# Patient Record
Sex: Male | Born: 1975 | Race: Black or African American | Hispanic: No | Marital: Married | Smoking: Never smoker
Health system: Southern US, Community
[De-identification: ages and names within clinical notes are randomized; demographics above are authoritative.]

---

## 2006-05-19 ENCOUNTER — Emergency Department (HOSPITAL_COMMUNITY): Admission: EM | Admit: 2006-05-19 | Discharge: 2006-05-19 | Payer: Self-pay | Admitting: Emergency Medicine

## 2007-04-05 ENCOUNTER — Emergency Department (HOSPITAL_COMMUNITY): Admission: EM | Admit: 2007-04-05 | Discharge: 2007-04-05 | Payer: Self-pay | Admitting: Emergency Medicine

## 2007-05-17 ENCOUNTER — Emergency Department (HOSPITAL_COMMUNITY): Admission: EM | Admit: 2007-05-17 | Discharge: 2007-05-17 | Payer: Self-pay | Admitting: Family Medicine

## 2010-03-27 ENCOUNTER — Emergency Department (HOSPITAL_COMMUNITY): Admission: EM | Admit: 2010-03-27 | Discharge: 2010-03-27 | Payer: Self-pay | Admitting: Emergency Medicine

## 2010-12-01 ENCOUNTER — Emergency Department (HOSPITAL_COMMUNITY)
Admission: EM | Admit: 2010-12-01 | Discharge: 2010-12-01 | Payer: Self-pay | Source: Home / Self Care | Admitting: Family Medicine

## 2018-10-07 ENCOUNTER — Emergency Department (HOSPITAL_COMMUNITY)
Admission: EM | Admit: 2018-10-07 | Discharge: 2018-10-07 | Disposition: A | Discharge status: Home / Self Care | Payer: No Typology Code available for payment source | Attending: Emergency Medicine | Admitting: Emergency Medicine

## 2018-10-07 ENCOUNTER — Emergency Department (HOSPITAL_COMMUNITY): Payer: No Typology Code available for payment source

## 2018-10-07 ENCOUNTER — Encounter (HOSPITAL_COMMUNITY): Payer: Self-pay | Admitting: *Deleted

## 2018-10-07 DIAGNOSIS — M5135 Other intervertebral disc degeneration, thoracolumbar region: Secondary | ICD-10-CM | POA: Diagnosis not present

## 2018-10-07 DIAGNOSIS — Z79899 Other long term (current) drug therapy: Secondary | ICD-10-CM | POA: Insufficient documentation

## 2018-10-07 DIAGNOSIS — M79605 Pain in left leg: Secondary | ICD-10-CM | POA: Diagnosis not present

## 2018-10-07 DIAGNOSIS — R202 Paresthesia of skin: Secondary | ICD-10-CM | POA: Insufficient documentation

## 2018-10-07 DIAGNOSIS — M5137 Other intervertebral disc degeneration, lumbosacral region: Secondary | ICD-10-CM

## 2018-10-07 DIAGNOSIS — R030 Elevated blood-pressure reading, without diagnosis of hypertension: Secondary | ICD-10-CM

## 2018-10-07 MED ORDER — PREDNISONE 10 MG PO TABS: 40.0000 mg | ORAL_TABLET | Freq: Every day | ORAL | 0 refills | Status: AC

## 2018-10-07 NOTE — ED Provider Notes (Signed)
[facility] EMERGENCY DEPARTMENT Provider Note   CSN: [id number] Arrival date & time: 10/07/18  4098     History   Chief Complaint Chief Complaint  Patient presents with  . Leg Pain    HPI Eric Page is a 42 y.o. male presenting for left leg pain that began on 09/16/2018 after an injury at work.  Patient states that he was working on a piece of equipment when his left foot slid out from under him causing him to to a "split".  Patient states that he caught himself before falling to the ground however had immediate pain around his left knee.  Patient describes the pain around his left knee and lower left thigh as a throbbing pain that is constant and mild in severity.  Pain is worsened with palpation of the area.  Patient has some associated tingling to the lateral left knee and lateral left lower thigh that is intermittent.  Patient states that he was evaluated by his primary care doctor as well as the employer's physician and prescribed muscle relaxer.  Patient states that his muscle relaxer has been helping with his pain and tingling.  Patient states that he presents today at the request of his employer's physician for "MRI of his knee"due to not improving pain.   Additionally patient states that he has had some left-sided lower back pain that developed over the past week that typically happens when he was sitting at work for long periods of time.  Patient denies injury or trauma to his back.  Patient states that this is a mild throb that he states is intermittent and only present after sitting for long periods of time.  Patient states that this pain is also improved with the muscle relaxer.  Patient denies saddle area paresthesias, bowel or bladder incontinence, fever, IV drug use, weakness of the extremities.  Patient states that he has not had previous imaging of his knee or lower back. HPI  History reviewed. No pertinent past medical history.  There are no  active problems to display for this patient.   History reviewed. No pertinent surgical history.      Home Medications    Prior to Admission medications   Medication Sig Start Date End Date Taking? Authorizing Provider  nabumetone (RELAFEN) 750 MG tablet Take 750 mg by mouth every 12 (twelve) hours.   Yes [provider]  tiZANidine (ZANAFLEX) 4 MG tablet Take 4 mg by mouth 3 (three) times daily.   Yes [provider]  predniSONE (DELTASONE) 10 MG tablet Take 4 tablets (40 mg total) by mouth daily for 5 days. 10/07/18 10/12/18  Bill Salinas, PA-C    Family History History reviewed. No pertinent family history.  Social History Social History   Tobacco Use  . Smoking status: Never Smoker  . Smokeless tobacco: Never Used  Substance Use Topics  . Alcohol use: Not on file  . Drug use: Not on file     Allergies   Patient has no known allergies.   Review of Systems Review of Systems  Constitutional: Negative.  Negative for chills and fever.  Musculoskeletal: Positive for arthralgias and back pain. Negative for neck pain.  Skin: Negative.  Negative for color change and wound.  Neurological: Positive for numbness. Negative for dizziness, syncope and weakness.    Physical Exam Updated Vital Signs BP (!) 152/97 (BP Location: Right Arm)   Pulse (!) 55   Temp 98 F (36.7 C)   Resp  16   SpO2 98%   Physical Exam  Constitutional: He appears well-developed and well-nourished. No distress.  HENT:  Head: Normocephalic and atraumatic.  Right Ear: External ear normal.  Left Ear: External ear normal.  Nose: Nose normal.  Eyes: Pupils are equal, round, and reactive to light. EOM are normal.  Neck: Trachea normal and normal range of motion. No tracheal deviation present.  Cardiovascular:  Pulses:      Dorsalis pedis pulses are 2+ on the right side, and 2+ on the left side.       Posterior tibial pulses are 2+ on the right side, and 2+ on the left side.    Pulmonary/Chest: Effort normal and breath sounds normal. No respiratory distress.  Abdominal: Soft. There is no tenderness. There is no rebound and no guarding.  Musculoskeletal: Normal range of motion.       Right knee: Normal.       Left knee: He exhibits normal range of motion, no swelling, no ecchymosis, no deformity and no erythema. Tenderness found. Lateral joint line tenderness noted. No medial joint line and no patellar tendon tenderness noted.       Right ankle: Normal.       Left ankle: Normal.       Cervical back: Normal.       Thoracic back: Normal.       Lumbar back: He exhibits no bony tenderness, no swelling and no deformity.       Back:       Right upper leg: Normal.       Left upper leg: He exhibits tenderness. He exhibits no swelling, no edema and no deformity.       Right lower leg: Normal.       Left lower leg: Normal.       Legs:      Right foot: Normal.       Left foot: Normal.  Left Knee:   Appearance normal. No obvious deformity. No skin swelling, erythema, heat, fluctuance or break of the skin.  Diffuse tenderness over lateral aspect of the knee as well as up lateral thigh along the IT band..  Active and passive flexion and extension intact with some increased pain however no crepitus. Negative anterior/poster drawer bilaterally. Negative ballottement test. No varus or valgus laxity or locking. No tenderness to palpation of hips or ankles.Compartments soft. Neurovascularly intact distally to site of injury.  No midline spinal tenderness to palpation.  No paraspinal muscular tenderness to palpation no spinal crepitus step-off or deformity noted.  Area indicated on image his were patient reports his intermittent lower back pain after sitting, left hip/lower lumbar region.  Feet:  Right Foot:  Protective Sensation: 3 sites tested. 3 sites sensed.  Left Foot:  Protective Sensation: 3 sites tested. 3 sites sensed.  Neurological: He is alert. No sensory deficit.  GCS eye subscore is 4. GCS verbal subscore is 5. GCS motor subscore is 6.  Speech is clear and goal oriented, follows commands Major Cranial nerves without deficit, no facial droop Normal strength in upper and lower extremities bilaterally including dorsiflexion and plantar flexion, strong and equal grip strength Sensation normal to light and sharp touch Moves extremities without ataxia, coordination intact Normal gait Normal heel-shin and balance  Skin: Skin is warm and dry. Capillary refill takes less than 2 seconds.  Psychiatric: He has a normal mood and affect. His behavior is normal.    ED Treatments / Results  Labs (all labs ordered are  listed, but only abnormal results are displayed) Labs Reviewed - No data to display  EKG None  Radiology Dg Lumbar Spine Complete  Result Date: 10/07/2018 CLINICAL DATA:  Left side pain, low back pain. EXAM: LUMBAR SPINE - COMPLETE 4+ VIEW COMPARISON:  None. FINDINGS: Disc space narrowing at L5-S1 and T12-L1. Normal alignment. No fracture. SI joints are symmetric and unremarkable. 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 No acute bony abnormality. Electronically Signed   By: Charlett Nose M.D.   On: 10/07/2018 11:05   Dg Knee Complete 4 Views Left  Result Date: 10/07/2018 CLINICAL DATA:  Got hurt on 9/20 still having Lat side pain and lower back with x-bilat post hip pain EXAM: LEFT KNEE - COMPLETE 4+ VIEW COMPARISON:  None. FINDINGS: No evidence of fracture, dislocation, or joint effusion. No evidence of arthropathy or other focal bone abnormality. Soft tissues are unremarkable. IMPRESSION: Negative. Electronically Signed   By: Norva Pavlov M.D.   On: 10/07/2018 11:06   Dg Hips Bilat W Or Wo Pelvis 2 Views  Result Date: 10/07/2018 CLINICAL DATA:  Got hurt on 9/20 still having Lat side pain and lower back with x-bilat post hip pain EXAM: DG HIP (WITH OR WITHOUT PELVIS) 2V BILAT COMPARISON:  None.  FINDINGS: There is no evidence of hip fracture or dislocation. There is no evidence of arthropathy or other focal bone abnormality. IMPRESSION: Negative. Electronically Signed   By: Norva Pavlov M.D.   On: 10/07/2018 11:05    Procedures Procedures (including critical care time)  Medications Ordered in ED Medications - No data to display   Initial Impression / Assessment and Plan / ED Course  I have reviewed the triage vital signs and the nursing notes.  Pertinent labs & imaging results that were available during my care of the patient were reviewed by me and considered in my medical decision making (see chart for details).  Clinical Course as of Oct 07 1130  Fri Oct 07, 2018  1038 Discussed with Dr. Adriana Simas, d/c with symptomatic treatment and prednisone and outpatient follow-up   [BM]    Clinical Course User Index [BM] Bill Salinas, PA-C   Patient presenting for approximately 3 weeks of left lateral knee and lower thigh pain and tingling after injury at work.  Physical examination today unremarkable, no signs of injury or infection.  No signs of vascular compromise.  Patient with full range of motion and strength however with increased pain.  Patient is ambulatory without assistance or difficulty.  Neurovascularly intact to bilateral lower extremities.  Patient has been using muscle relaxer and anti-inflammatory as prescribed by primary care provider with great relief of symptoms.  Imaging of hips negative.   Imaging of left knee negative.   Imaging of lumbar spine with some degenerative disc disease however no acute findings.  Patient informed of imaging findings and need for follow-up.  No neurological deficits and normal neuro exam.  Patient can walk but states is painful.  Patient denies loss of bowel/bladder control or saddle area paresthesias.  No concern for cauda equina.  No fever, night sweats, weight loss, h/o cancer, or IVDU. RICE protocol discussed with patient.   Patient informed that he may continue to use anti-inflammatory muscle relaxer as prescribed by primary care.  Patient's paresthesias discussed with Dr. Adriana Simas.  Likely nerve inflammation.  Patient has been prescribed a short course of prednisone.  Patient denies recent steroid use.  Patient denies history of diabetes.  Patient has  been given orthopedic referral.  Patient also informed of elevated blood pressure in emergency department today.  Patient asymptomatic at this time.  Patient informed to follow-up with his primary care provider for blood pressure recheck and medication management as soon as possible.  Patient informed of signs and symptoms of hypertensive urgency and emergency and to return to emergency department if these develop.  At this time there does not appear to be any evidence of an acute emergency medical condition and the patient appears stable for discharge with appropriate outpatient follow up. Diagnosis was discussed with patient who verbalizes understanding of care plan and is agreeable to discharge. I have discussed return precautions with patient who verbalizes understanding of return precautions. Patient strongly encouraged to follow-up with their PCP. All questions answered.  Patient's case discussed with Dr. Adriana Simas who agrees with plan to discharge with prednisone and follow-up.   Note: Portions of this report may have been transcribed using voice recognition software. Every effort was made to ensure accuracy; however, inadvertent computerized transcription errors may still be present.  Final Clinical Impressions(s) / ED Diagnoses   Final diagnoses:  Left leg pain  Paresthesia of left leg  Elevated blood pressure reading    ED Discharge Orders         Ordered    predniSONE (DELTASONE) 10 MG tablet  Daily     10/07/18 1129           Elizabeth Palau 10/07/18 1214    Donnetta Hutching, MD 10/08/18 0745

## 2018-10-07 NOTE — ED Triage Notes (Signed)
Pt in c/o left leg injury in September while at work, has a Scientist, water quality comp claim but states pain isn't getting better so he came here to get an MRI, pt ambulatory to room without distress, also c/o lower back pain as well

## 2018-10-07 NOTE — Discharge Instructions (Signed)
Please return to the Emergency Department for any new or worsening symptoms or if your symptoms do not improve. Please be sure to follow up with your Primary Care Physician as soon as possible regarding your visit today. If you do not have a Primary Doctor please use the resources below to establish one. It is likely that the tingling to your lateral left knee is due to nerve inflammation.  Please use the prednisone as prescribed to help with the tingling.   You may continue to use the muscle relaxer and anti-inflammatory as prescribed by your primary care doctor. Please follow-up with the orthopedic specialist in regards to your knee pain and tingling. Your x-rays today showed some degenerative disc disease of your spine.  Please follow-up with your primary care doctor and the orthopedic doctor regarding this. Your blood pressure was elevated today in the emergency department.  Please follow-up with your primary care doctor for blood pressure recheck and medication management as soon as possible.  Contact a health care provider if: You continue to have episodes of paresthesia. Your burning or prickling feeling gets worse when you walk. You have pain, cramps, or dizziness. You develop a rash. Get help right away if: You feel weak. You have trouble walking or moving. You have problems with speech, understanding, or vision. You feel confused. You cannot control your bladder or bowel movements. You have numbness after an injury. You faint. Contact a health care provider if: Your knee pain continues, changes, or gets worse. You have a fever along with knee pain. Your knee buckles or locks up. Your knee swells, and the swelling becomes worse. Get help right away if: Your knee feels warm to the touch. You cannot move your knee. You have severe pain in your knee. You have chest pain. You have trouble breathing. Do not take your medicine if  develop an itchy rash, swelling in your mouth or  lips, or difficulty breathing.   RESOURCE GUIDE  Chronic Pain Problems: Contact [facility]  [phone] Patients need to be referred by their primary care doctor.  Insufficient Money for Medicine: Contact United Way:  call "211" or [facility] 218-742-7324.  No Primary Care Doctor: Call [facility]  [phone] - can help you locate a primary care doctor that  accepts your insurance, provides certain services, etc. Physician Referral [phone]  Agencies that provide inexpensive medical care: [facility]  [phone] [facility]  [phone] [facility]  [phone] [facility]  [phone] Planned Parenthood  [phone] [facility]  [phone]  Medicaid-accepting [facility] Providers: [facility]- [address]  [phone], Mon-Fri 9am-7pm, Sat 9am-1pm [facility]- [address] [place], [address] [place]  [phone] [facility]- [address] [place]  [phone] [facility]- [address]  [phone] Renaye Rakers- [address] [place], [address], [phone]  Only accepts [place] [facility] [place] patients after they have their name  applied to their card  Self Pay (no insurance) in [facility]: Sickle Cell Patients: Dr Willey Blade, [facility]  [address] [place], [phone] [facility]gent Care- [address] [place]  [phone]       [facility] [place]- [address]       -     [facility]- see information  above (Speak to [facility] if you do not have insurance)       -  [facility]- [address] [place], [phone]       -  [facility]- [address]  [phone]       -  [facility]- [address] [phone]       -  Dr Julio Sicks-  [address], [place], [phone]       -  [facility]- [address] [phone]       -  [facility]- [address] [phone], also [address], [phone]       -    [facility]- [address] [place], [phone], 1st & 3rd Saturday   every month, 10am-1pm  1) Find a Doctor and Pay Out of Pocket Although you won't have to find out who is covered by your insurance plan, it is a good idea to ask around and get recommendations. You will then need to call the office and see if the doctor you have chosen will accept you as a new patient and what types of options they offer for patients who are self-pay. Some doctors offer discounts or will set up payment plans for their patients who do not have insurance, but you will need to ask so you aren't surprised when you get to your appointment.  2) Contact Your Local Health Department Not all health departments have doctors that can see patients for sick visits, but many do, so it is worth a call to see if yours does. If you don't know where your local health department is, you can check in your phone book. The CDC also has a tool to help you locate your state's health department, and many state websites also have listings of all of their local health departments.  3) Find a Walk-in Clinic If your illness is not likely to be very severe or complicated, you may want to try a walk in clinic. These are popping up all over the country in pharmacies, drugstores, and shopping centers. They're usually staffed by nurse practitioners or physician assistants that have been trained to treat common illnesses and complaints. They're usually fairly quick and inexpensive. However, if you have serious medical issues or chronic medical problems, these are probably not your best option  STD Testing [facility] Department of [facility], STD Clinic, [address] [place], phone [phone] or [phone].  Monday - Friday, call for an appointment. [facility] Department  of [employer], STD Clinic, [place] [address], [place], phone [phone] or [phone].  Monday - Friday, call for an appointment.  Abuse/Neglect: [facility] Child Abuse Hotline 5758769583 [facility] Child Abuse Hotline 850-852-2556 (After Hours)  Emergency Shelter:  [facility] 352-564-1981  Maternity Homes: Room at the [place] [facility] ([phone] 028-9891 [facility] (252)360-9871  MRSA Hotline #:   419-584-6571  [facility] Resources  [facility] [place]  [employer] [facility] Dept. [address]                 [address]         [address] [place] [address]  [address]  Cristobal Goldmann Phone:  [phone]                                  Phone:  [phone]                   Phone:  [phone]  [facility], [phone] [facility] [place]- [phone]       -     [facility] in [place], [address]                                  6152657451, [facility] Child Abuse Hotline 718-781-4643 or 678 268 3681 (After Hours)   Behavioral Health Services  Substance Abuse Resources: Alcohol and Drug Services  850-612-2381 [facility] (360)831-8602 [facility] (316)686-9336 Floydene Flock (202)266-1980 Residential & Outpatient Substance Abuse Program  (608) [phone]  Psychological Services: [facility]  [phone] [facility] Services  [phone] [facility], [phone] [place]. [address], [place], ACCESS LINE: 717-624-5620 or 3854904929, [employer]za  Dental Assistance  If unable to pay or uninsured, contact:  [facility] or [facility]. to become qualified for the adult dental clinic.  Patients with Medicaid: [facility] [phone] [address], [phone] [address], [phone]  If unable to pay, or uninsured, contact [facility] (469)233-2891) or [facility] Department [phone] in [place], [phone] in [facility] to become qualified for the adult dental clinic   Other Low-Cost Community Dental Services: [facility]- [address] [place], [place], [place], [address], [phone], [address], 2nd and 4th Thursday of the month at 6:30am.  10 clients each day by appointment, can sometimes see walk-in patients if someone does not show for an appointment. [facility]- [address] Ether Griffins [place], [place], [address], [phone] [facility] [address], [place], [place], [address], [phone] [facility] Health Department- [phone] [facility] Health Department- [phone] [facility] [phone]

## 2018-10-07 NOTE — ED Notes (Signed)
Patient transported to X-ray 

## 2019-08-16 IMAGING — DX DG HIP (WITH OR WITHOUT PELVIS) 2V BILAT
3 series · 3 of 3 positions shown · non-contrast
Comparison: None.

CLINICAL DATA: Got hurt on [DATE] still having Lat side pain and
lower back with x-bilat post hip pain

EXAM:
DG HIP (WITH OR WITHOUT PELVIS) 2V BILAT

[t pelvis ap]
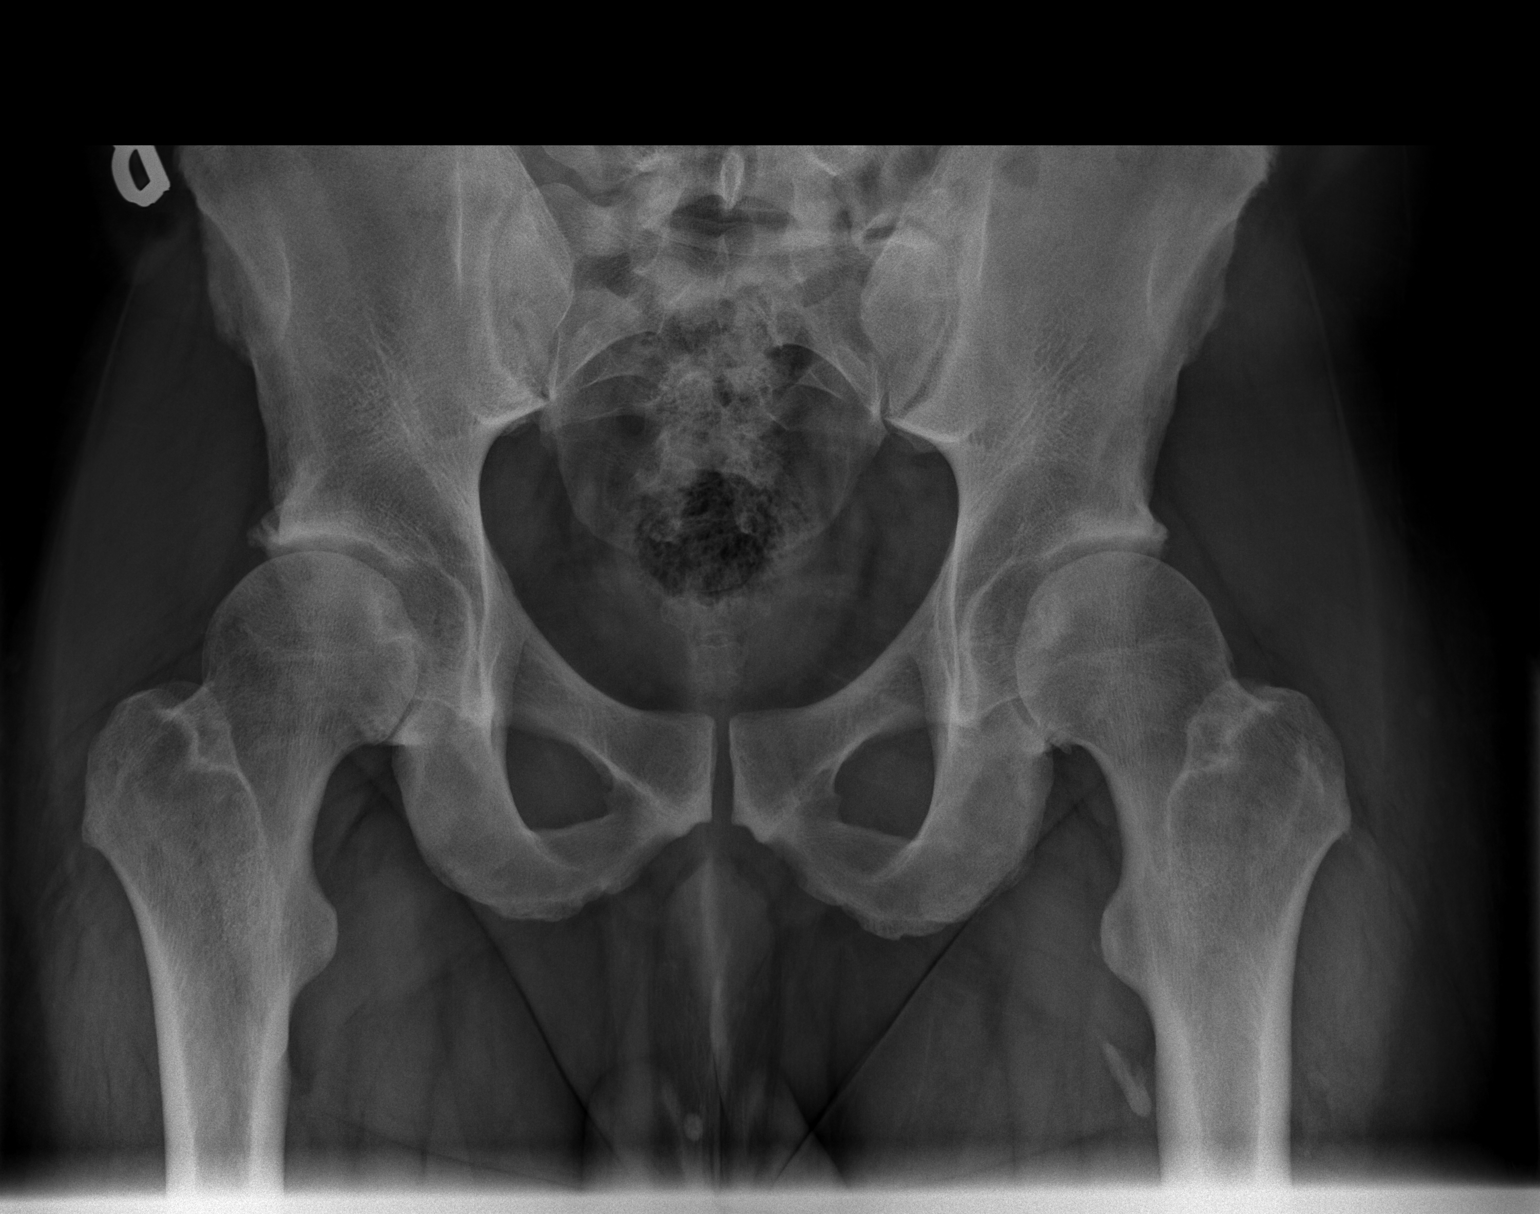

[t hip frog leg right]
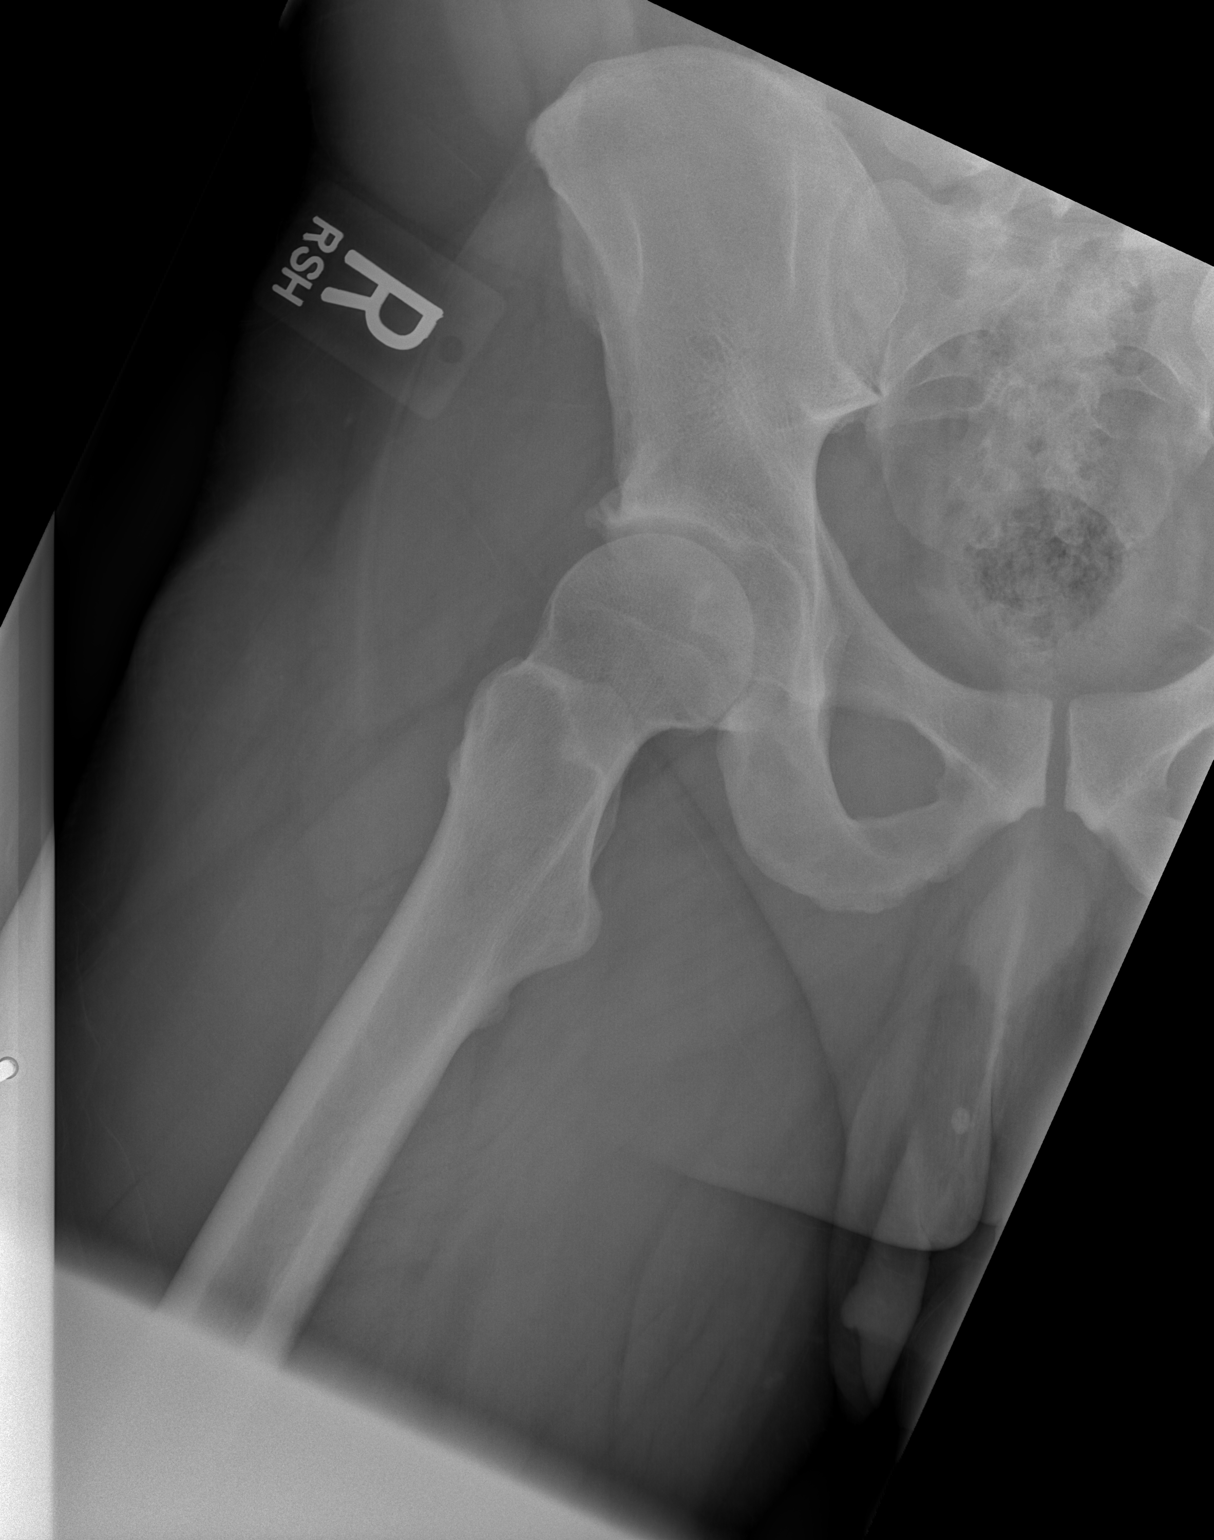

[t hip frog leg left]
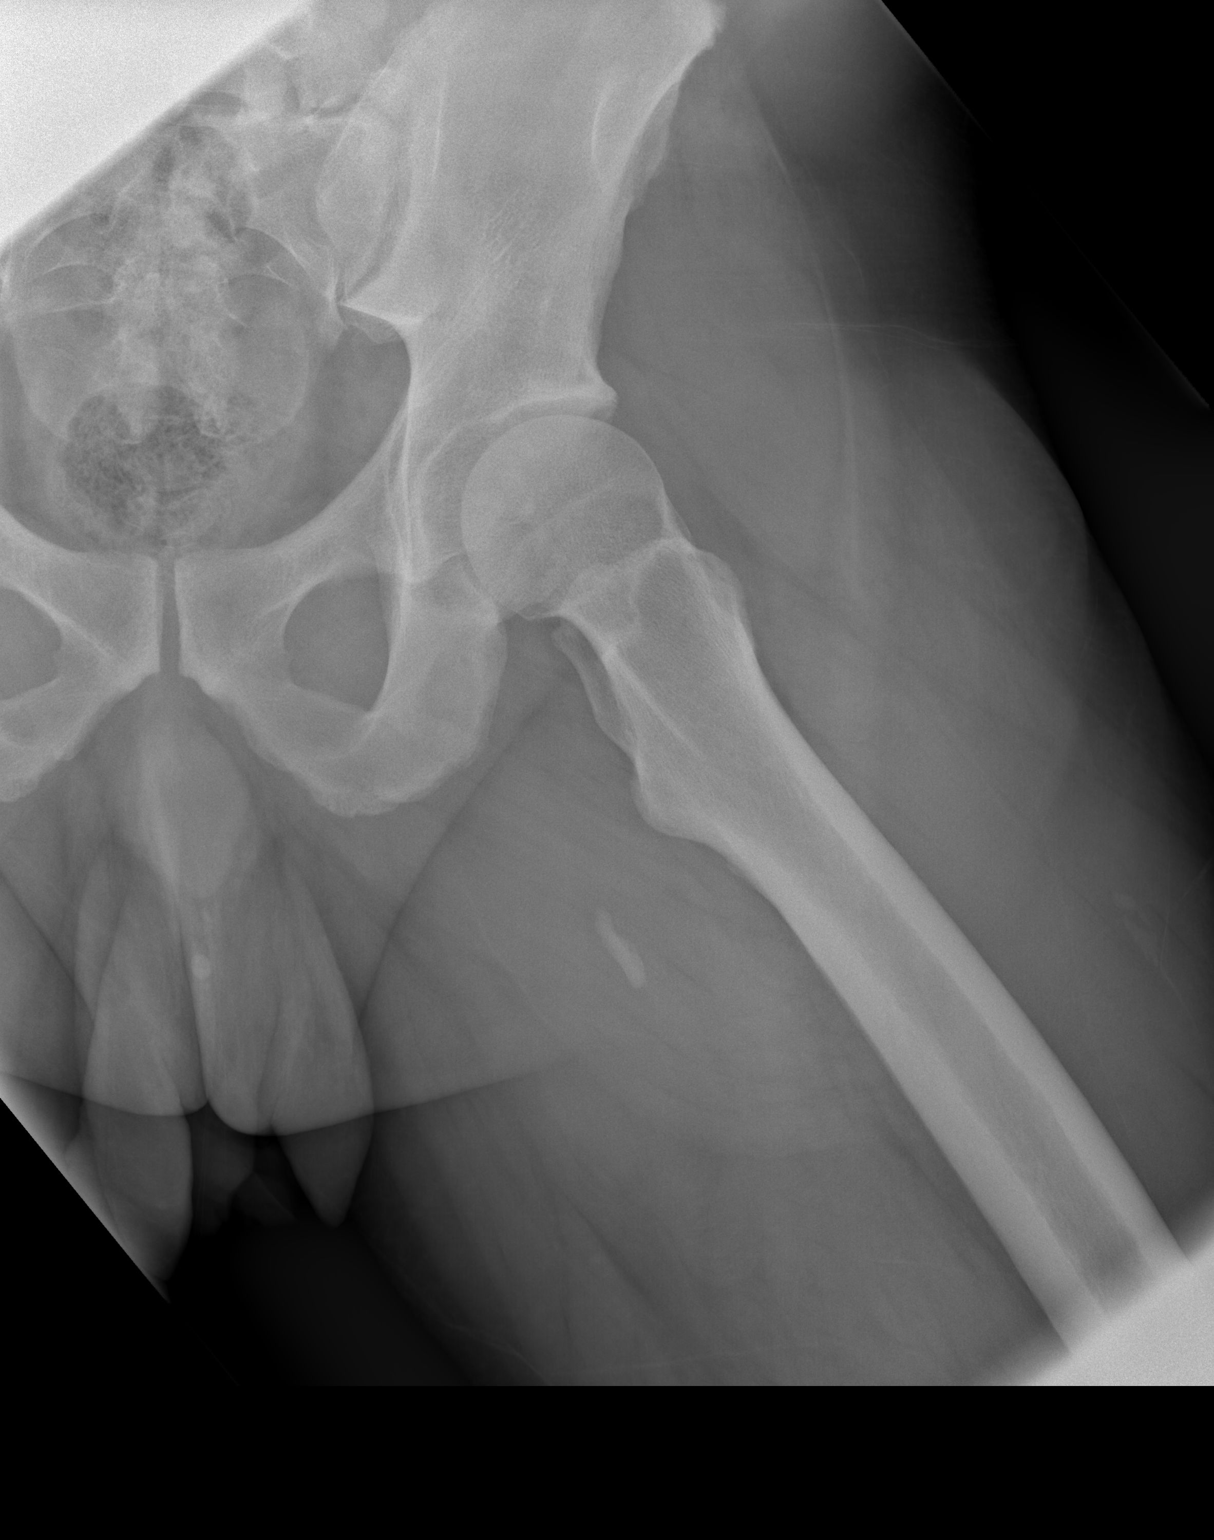

[3 of 3 positions shown; findings below may reference images not displayed]

FINDINGS: There is no evidence of hip fracture or dislocation. There is no
evidence of arthropathy or other focal bone abnormality.
IMPRESSION: Negative.
